# Patient Record
Sex: Male | Born: 1995 | Race: White | Hispanic: No | Marital: Single | Smoking: Smoker, current status unknown
Health system: Southern US, Community
[De-identification: ages and names within clinical notes are randomized; demographics above are authoritative.]

---
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
## 2013-09-15 ENCOUNTER — Inpatient Hospital Stay (HOSPITAL_COMMUNITY)
Admission: EM | Admit: 2013-09-15 | Discharge: 2013-09-21 | DRG: 963 | Disposition: A | Discharge status: Home Health | Payer: Medicaid Other | Attending: General Surgery | Admitting: General Surgery

## 2013-09-15 DIAGNOSIS — IMO0002 Reserved for concepts with insufficient information to code with codable children: Secondary | ICD-10-CM | POA: Diagnosis present

## 2013-09-15 DIAGNOSIS — S8263XA Displaced fracture of lateral malleolus of unspecified fibula, initial encounter for closed fracture: Secondary | ICD-10-CM | POA: Diagnosis present

## 2013-09-15 DIAGNOSIS — S058X9A Other injuries of unspecified eye and orbit, initial encounter: Secondary | ICD-10-CM | POA: Diagnosis present

## 2013-09-15 DIAGNOSIS — S270XXA Traumatic pneumothorax, initial encounter: Secondary | ICD-10-CM | POA: Diagnosis present

## 2013-09-15 DIAGNOSIS — R0902 Hypoxemia: Secondary | ICD-10-CM | POA: Diagnosis present

## 2013-09-15 DIAGNOSIS — S82109A Unspecified fracture of upper end of unspecified tibia, initial encounter for closed fracture: Secondary | ICD-10-CM | POA: Diagnosis present

## 2013-09-15 DIAGNOSIS — S02109A Fracture of base of skull, unspecified side, initial encounter for closed fracture: Principal | ICD-10-CM | POA: Diagnosis present

## 2013-09-15 DIAGNOSIS — S0180XA Unspecified open wound of other part of head, initial encounter: Secondary | ICD-10-CM | POA: Diagnosis present

## 2013-09-15 DIAGNOSIS — S064X9A Epidural hemorrhage with loss of consciousness of unspecified duration, initial encounter: Secondary | ICD-10-CM

## 2013-09-15 DIAGNOSIS — S066X9A Traumatic subarachnoid hemorrhage with loss of consciousness of unspecified duration, initial encounter: Principal | ICD-10-CM

## 2013-09-15 DIAGNOSIS — I498 Other specified cardiac arrhythmias: Secondary | ICD-10-CM | POA: Diagnosis present

## 2013-09-15 DIAGNOSIS — S064XAA Epidural hemorrhage with loss of consciousness status unknown, initial encounter: Secondary | ICD-10-CM

## 2013-09-15 DIAGNOSIS — H11429 Conjunctival edema, unspecified eye: Secondary | ICD-10-CM | POA: Diagnosis present

## 2013-09-15 DIAGNOSIS — S82891A Other fracture of right lower leg, initial encounter for closed fracture: Secondary | ICD-10-CM | POA: Diagnosis present

## 2013-09-15 DIAGNOSIS — S020XXA Fracture of vault of skull, initial encounter for closed fracture: Secondary | ICD-10-CM | POA: Diagnosis present

## 2013-09-15 DIAGNOSIS — S0230XA Fracture of orbital floor, unspecified side, initial encounter for closed fracture: Secondary | ICD-10-CM | POA: Diagnosis present

## 2013-09-15 DIAGNOSIS — S065X9A Traumatic subdural hemorrhage with loss of consciousness of unspecified duration, initial encounter: Principal | ICD-10-CM

## 2013-09-15 DIAGNOSIS — S0219XA Other fracture of base of skull, initial encounter for closed fracture: Secondary | ICD-10-CM

## 2013-09-15 DIAGNOSIS — S0292XA Unspecified fracture of facial bones, initial encounter for closed fracture: Secondary | ICD-10-CM

## 2013-09-15 DIAGNOSIS — F17200 Nicotine dependence, unspecified, uncomplicated: Secondary | ICD-10-CM | POA: Diagnosis present

## 2013-09-15 DIAGNOSIS — S0280XA Fracture of other specified skull and facial bones, unspecified side, initial encounter for closed fracture: Secondary | ICD-10-CM | POA: Diagnosis present

## 2013-09-15 DIAGNOSIS — J690 Pneumonitis due to inhalation of food and vomit: Secondary | ICD-10-CM | POA: Diagnosis present

## 2013-09-15 DIAGNOSIS — S82142A Displaced bicondylar fracture of left tibia, initial encounter for closed fracture: Secondary | ICD-10-CM

## 2013-09-16 ENCOUNTER — Inpatient Hospital Stay (HOSPITAL_COMMUNITY): Payer: Medicaid Other

## 2013-09-16 ENCOUNTER — Emergency Department (HOSPITAL_COMMUNITY): Payer: Medicaid Other

## 2013-09-16 ENCOUNTER — Encounter (HOSPITAL_COMMUNITY): Payer: Self-pay | Admitting: Emergency Medicine

## 2013-09-16 DIAGNOSIS — S064X9A Epidural hemorrhage with loss of consciousness of unspecified duration, initial encounter: Secondary | ICD-10-CM

## 2013-09-16 DIAGNOSIS — R0902 Hypoxemia: Secondary | ICD-10-CM | POA: Diagnosis present

## 2013-09-16 DIAGNOSIS — S0280XA Fracture of other specified skull and facial bones, unspecified side, initial encounter for closed fracture: Secondary | ICD-10-CM | POA: Diagnosis present

## 2013-09-16 DIAGNOSIS — S066XAA Traumatic subarachnoid hemorrhage with loss of consciousness status unknown, initial encounter: Secondary | ICD-10-CM

## 2013-09-16 DIAGNOSIS — S066X9A Traumatic subarachnoid hemorrhage with loss of consciousness of unspecified duration, initial encounter: Secondary | ICD-10-CM | POA: Diagnosis present

## 2013-09-16 DIAGNOSIS — S270XXA Traumatic pneumothorax, initial encounter: Secondary | ICD-10-CM | POA: Diagnosis present

## 2013-09-16 DIAGNOSIS — S058X9A Other injuries of unspecified eye and orbit, initial encounter: Secondary | ICD-10-CM | POA: Diagnosis present

## 2013-09-16 DIAGNOSIS — S065XAA Traumatic subdural hemorrhage with loss of consciousness status unknown, initial encounter: Secondary | ICD-10-CM

## 2013-09-16 DIAGNOSIS — S0280XB Fracture of other specified skull and facial bones, unspecified side, initial encounter for open fracture: Secondary | ICD-10-CM

## 2013-09-16 DIAGNOSIS — S064XAA Epidural hemorrhage with loss of consciousness status unknown, initial encounter: Secondary | ICD-10-CM

## 2013-09-16 DIAGNOSIS — S020XXA Fracture of vault of skull, initial encounter for closed fracture: Secondary | ICD-10-CM | POA: Diagnosis present

## 2013-09-16 DIAGNOSIS — S0180XA Unspecified open wound of other part of head, initial encounter: Secondary | ICD-10-CM | POA: Diagnosis present

## 2013-09-16 DIAGNOSIS — J690 Pneumonitis due to inhalation of food and vomit: Secondary | ICD-10-CM | POA: Diagnosis present

## 2013-09-16 DIAGNOSIS — IMO0002 Reserved for concepts with insufficient information to code with codable children: Secondary | ICD-10-CM | POA: Diagnosis present

## 2013-09-16 DIAGNOSIS — S02109A Fracture of base of skull, unspecified side, initial encounter for closed fracture: Secondary | ICD-10-CM | POA: Diagnosis not present

## 2013-09-16 DIAGNOSIS — J95821 Acute postprocedural respiratory failure: Secondary | ICD-10-CM

## 2013-09-16 DIAGNOSIS — S8263XA Displaced fracture of lateral malleolus of unspecified fibula, initial encounter for closed fracture: Secondary | ICD-10-CM | POA: Diagnosis present

## 2013-09-16 DIAGNOSIS — R404 Transient alteration of awareness: Secondary | ICD-10-CM | POA: Diagnosis present

## 2013-09-16 DIAGNOSIS — S0230XA Fracture of orbital floor, unspecified side, initial encounter for closed fracture: Secondary | ICD-10-CM | POA: Diagnosis present

## 2013-09-16 DIAGNOSIS — F17200 Nicotine dependence, unspecified, uncomplicated: Secondary | ICD-10-CM | POA: Diagnosis present

## 2013-09-16 DIAGNOSIS — I498 Other specified cardiac arrhythmias: Secondary | ICD-10-CM | POA: Diagnosis present

## 2013-09-16 DIAGNOSIS — H11429 Conjunctival edema, unspecified eye: Secondary | ICD-10-CM | POA: Diagnosis present

## 2013-09-16 DIAGNOSIS — S065X9A Traumatic subdural hemorrhage with loss of consciousness of unspecified duration, initial encounter: Secondary | ICD-10-CM | POA: Diagnosis not present

## 2013-09-16 DIAGNOSIS — S82109A Unspecified fracture of upper end of unspecified tibia, initial encounter for closed fracture: Secondary | ICD-10-CM | POA: Diagnosis present

## 2013-09-16 LAB — COMPREHENSIVE METABOLIC PANEL
ALT: 59 U/L — ABNORMAL HIGH (ref 0–53)
ANION GAP: 17 — AB (ref 5–15)
AST: 85 U/L — AB (ref 0–37)
Albumin: 4.2 g/dL (ref 3.5–5.2)
Alkaline Phosphatase: 78 U/L (ref 39–117)
BILIRUBIN TOTAL: 0.7 mg/dL (ref 0.3–1.2)
BUN: 13 mg/dL (ref 6–23)
CHLORIDE: 101 meq/L (ref 96–112)
CO2: 21 meq/L (ref 19–32)
Calcium: 9 mg/dL (ref 8.4–10.5)
Creatinine, Ser: 0.99 mg/dL (ref 0.50–1.35)
Glucose, Bld: 165 mg/dL — ABNORMAL HIGH (ref 70–99)
Potassium: 3.2 mEq/L — ABNORMAL LOW (ref 3.7–5.3)
Sodium: 139 mEq/L (ref 137–147)
Total Protein: 7.3 g/dL (ref 6.0–8.3)

## 2013-09-16 LAB — CBC
HCT: 38.5 % — ABNORMAL LOW (ref 39.0–52.0)
HCT: 44.3 % (ref 39.0–52.0)
Hemoglobin: 12.9 g/dL — ABNORMAL LOW (ref 13.0–17.0)
Hemoglobin: 15.2 g/dL (ref 13.0–17.0)
MCH: 29.7 pg (ref 26.0–34.0)
MCH: 30.4 pg (ref 26.0–34.0)
MCHC: 33.5 g/dL (ref 30.0–36.0)
MCHC: 34.3 g/dL (ref 30.0–36.0)
MCV: 88.6 fL (ref 78.0–100.0)
MCV: 88.7 fL (ref 78.0–100.0)
PLATELETS: 200 10*3/uL (ref 150–400)
Platelets: 288 10*3/uL (ref 150–400)
RBC: 4.34 MIL/uL (ref 4.22–5.81)
RBC: 5 MIL/uL (ref 4.22–5.81)
RDW: 12.8 % (ref 11.5–15.5)
RDW: 12.8 % (ref 11.5–15.5)
WBC: 10.3 10*3/uL (ref 4.0–10.5)
WBC: 18.5 10*3/uL — AB (ref 4.0–10.5)

## 2013-09-16 LAB — I-STAT ARTERIAL BLOOD GAS, ED
ACID-BASE DEFICIT: 1 mmol/L (ref 0.0–2.0)
Bicarbonate: 24.3 mEq/L — ABNORMAL HIGH (ref 20.0–24.0)
O2 Saturation: 100 %
PO2 ART: 279 mmHg — AB (ref 80.0–100.0)
Patient temperature: 98.6
TCO2: 26 mmol/L (ref 0–100)
pCO2 arterial: 42.3 mmHg (ref 35.0–45.0)
pH, Arterial: 7.367 (ref 7.350–7.450)

## 2013-09-16 LAB — ETHANOL

## 2013-09-16 LAB — MRSA PCR SCREENING: MRSA by PCR: NEGATIVE

## 2013-09-16 LAB — BASIC METABOLIC PANEL
Anion gap: 12 (ref 5–15)
BUN: 13 mg/dL (ref 6–23)
CHLORIDE: 107 meq/L (ref 96–112)
CO2: 24 meq/L (ref 19–32)
Calcium: 8.2 mg/dL — ABNORMAL LOW (ref 8.4–10.5)
Creatinine, Ser: 0.87 mg/dL (ref 0.50–1.35)
GFR calc non Af Amer: >90 mL/min (ref >90)
Glucose, Bld: 108 mg/dL — ABNORMAL HIGH (ref 70–99)
Potassium: 4.3 mEq/L (ref 3.7–5.3)
SODIUM: 143 meq/L (ref 137–147)

## 2013-09-16 LAB — I-STAT CHEM 8, ED
BUN: 13 mg/dL (ref 6–23)
CALCIUM ION: 1.17 mmol/L (ref 1.12–1.23)
Chloride: 102 mEq/L (ref 96–112)
Creatinine, Ser: 1.1 mg/dL (ref 0.50–1.35)
Glucose, Bld: 176 mg/dL — ABNORMAL HIGH (ref 70–99)
HCT: 48 % (ref 39.0–52.0)
HEMOGLOBIN: 16.3 g/dL (ref 13.0–17.0)
Potassium: 3 mEq/L — ABNORMAL LOW (ref 3.7–5.3)
SODIUM: 140 meq/L (ref 137–147)
TCO2: 21 mmol/L (ref 0–100)

## 2013-09-16 LAB — CDS SEROLOGY

## 2013-09-16 LAB — PREPARE FRESH FROZEN PLASMA
UNIT DIVISION: 0
Unit division: 0

## 2013-09-16 LAB — ABO/RH: ABO/RH(D): A POS

## 2013-09-16 LAB — TRIGLYCERIDES: TRIGLYCERIDES: 86 mg/dL (ref <150)

## 2013-09-16 LAB — I-STAT CG4 LACTIC ACID, ED: LACTIC ACID, VENOUS: 3.32 mmol/L — AB (ref 0.5–2.2)

## 2013-09-16 LAB — PROTIME-INR
INR: 1.13 (ref 0.00–1.49)
PROTHROMBIN TIME: 14.5 s (ref 11.6–15.2)

## 2013-09-16 MED ORDER — FENTANYL CITRATE 0.05 MG/ML IJ SOLN
50.0000 ug | Freq: Once | INTRAMUSCULAR | Status: AC
  Administered 2013-09-16: 50 ug via INTRAVENOUS

## 2013-09-16 MED ORDER — SUCCINYLCHOLINE CHLORIDE 20 MG/ML IJ SOLN
INTRAMUSCULAR | Status: AC
  Filled 2013-09-16: qty 1

## 2013-09-16 MED ORDER — BACITRACIN-NEOMYCIN-POLYMYXIN OINTMENT TUBE
TOPICAL_OINTMENT | Freq: Three times a day (TID) | CUTANEOUS | Status: DC
  Administered 2013-09-16: 1 via TOPICAL
  Administered 2013-09-16 (×2): via TOPICAL
  Administered 2013-09-16: 1 via TOPICAL
  Administered 2013-09-17 – 2013-09-21 (×13): via TOPICAL
  Filled 2013-09-16 (×2): qty 15

## 2013-09-16 MED ORDER — PROPOFOL 10 MG/ML IV EMUL
0.0000 ug/kg/min | INTRAVENOUS | Status: DC
  Administered 2013-09-16: 23 ug/kg/min via INTRAVENOUS
  Administered 2013-09-16 – 2013-09-17 (×2): 20 ug/kg/min via INTRAVENOUS
  Administered 2013-09-17 – 2013-09-18 (×3): 30 ug/kg/min via INTRAVENOUS
  Filled 2013-09-16 (×5): qty 100

## 2013-09-16 MED ORDER — PANTOPRAZOLE SODIUM 40 MG IV SOLR
40.0000 mg | Freq: Every day | INTRAVENOUS | Status: DC
  Administered 2013-09-16 – 2013-09-18 (×3): 40 mg via INTRAVENOUS
  Filled 2013-09-16 (×5): qty 40

## 2013-09-16 MED ORDER — PROPOFOL 10 MG/ML IV EMUL
INTRAVENOUS | Status: AC
  Filled 2013-09-16: qty 100

## 2013-09-16 MED ORDER — ACETAMINOPHEN 160 MG/5ML PO SOLN
650.0000 mg | Freq: Four times a day (QID) | ORAL | Status: DC | PRN
  Administered 2013-09-16 – 2013-09-19 (×6): 650 mg
  Filled 2013-09-16 (×7): qty 20.3

## 2013-09-16 MED ORDER — BIOTENE DRY MOUTH MT LIQD
15.0000 mL | Freq: Four times a day (QID) | OROMUCOSAL | Status: DC
  Administered 2013-09-16 – 2013-09-21 (×21): 15 mL via OROMUCOSAL

## 2013-09-16 MED ORDER — ATROPINE SULFATE 1 MG/ML IJ SOLN
INTRAMUSCULAR | Status: DC | PRN
  Administered 2013-09-16: .5 mg via INTRAVENOUS

## 2013-09-16 MED ORDER — PNEUMOCOCCAL VAC POLYVALENT 25 MCG/0.5ML IJ INJ
0.5000 mL | INJECTION | INTRAMUSCULAR | Status: DC
  Filled 2013-09-16: qty 0.5

## 2013-09-16 MED ORDER — CHLORHEXIDINE GLUCONATE 0.12 % MT SOLN
OROMUCOSAL | Status: AC
  Filled 2013-09-16: qty 15

## 2013-09-16 MED ORDER — PROPOFOL 10 MG/ML IV EMUL
5.0000 ug/kg/min | Freq: Once | INTRAVENOUS | Status: DC
  Filled 2013-09-16: qty 100

## 2013-09-16 MED ORDER — ONDANSETRON HCL 4 MG/2ML IJ SOLN
INTRAMUSCULAR | Status: AC
  Administered 2013-09-16: 4 mg
  Filled 2013-09-16: qty 2

## 2013-09-16 MED ORDER — PANTOPRAZOLE SODIUM 40 MG PO TBEC
40.0000 mg | DELAYED_RELEASE_TABLET | Freq: Every day | ORAL | Status: DC
  Administered 2013-09-19 – 2013-09-20 (×2): 40 mg via ORAL
  Filled 2013-09-16 (×2): qty 1

## 2013-09-16 MED ORDER — FENTANYL CITRATE 0.05 MG/ML IJ SOLN
INTRAMUSCULAR | Status: AC
  Filled 2013-09-16: qty 2

## 2013-09-16 MED ORDER — DEXTROSE 5 % IV SOLN: 1.0000 g | Freq: Once | INTRAVENOUS | Status: DC

## 2013-09-16 MED ORDER — LIDOCAINE HCL (CARDIAC) 20 MG/ML IV SOLN
INTRAVENOUS | Status: AC
  Filled 2013-09-16: qty 5

## 2013-09-16 MED ORDER — SODIUM CHLORIDE 0.9 % IV SOLN
INTRAVENOUS | Status: DC | PRN
  Administered 2013-09-16 (×2): 1000 mL via INTRAVENOUS

## 2013-09-16 MED ORDER — SODIUM CHLORIDE 0.9 % IV SOLN
0.0000 ug/h | INTRAVENOUS | Status: DC
  Administered 2013-09-16: 25 ug/h via INTRAVENOUS
  Administered 2013-09-17: 100 ug/h via INTRAVENOUS
  Filled 2013-09-16 (×3): qty 50

## 2013-09-16 MED ORDER — ROCURONIUM BROMIDE 50 MG/5ML IV SOLN
INTRAVENOUS | Status: AC
  Filled 2013-09-16: qty 2

## 2013-09-16 MED ORDER — BIOTENE DRY MOUTH MT LIQD: 15.0000 mL | Freq: Four times a day (QID) | OROMUCOSAL | Status: DC

## 2013-09-16 MED ORDER — FENTANYL BOLUS VIA INFUSION
50.0000 ug | INTRAVENOUS | Status: DC | PRN
  Filled 2013-09-16: qty 100

## 2013-09-16 MED ORDER — LIDOCAINE-EPINEPHRINE (PF) 2 %-1:200000 IJ SOLN
10.0000 mL | Freq: Once | INTRAMUSCULAR | Status: AC
  Administered 2013-09-16: 10 mL via INTRADERMAL

## 2013-09-16 MED ORDER — SUCCINYLCHOLINE CHLORIDE 20 MG/ML IJ SOLN
INTRAMUSCULAR | Status: DC | PRN
  Administered 2013-09-16: 50 mg via INTRAVENOUS
  Administered 2013-09-16 (×2): 100 mg via INTRAVENOUS

## 2013-09-16 MED ORDER — SODIUM CHLORIDE 0.9 % IV SOLN
INTRAVENOUS | Status: DC
  Administered 2013-09-16: 100 mL/h via INTRAVENOUS
  Administered 2013-09-17 – 2013-09-18 (×4): via INTRAVENOUS

## 2013-09-16 MED ORDER — FENTANYL CITRATE 0.05 MG/ML IJ SOLN
INTRAMUSCULAR | Status: DC | PRN
  Administered 2013-09-16: 100 ug via INTRAVENOUS

## 2013-09-16 MED ORDER — PROPOFOL 10 MG/ML IV EMUL
INTRAVENOUS | Status: DC | PRN
  Administered 2013-09-16: 20 ug/kg/min via INTRAVENOUS

## 2013-09-16 MED ORDER — CHLORHEXIDINE GLUCONATE 0.12 % MT SOLN
15.0000 mL | Freq: Two times a day (BID) | OROMUCOSAL | Status: DC
  Administered 2013-09-16 – 2013-09-21 (×10): 15 mL via OROMUCOSAL
  Filled 2013-09-16 (×8): qty 15

## 2013-09-16 MED ORDER — ERYTHROMYCIN 5 MG/GM OP OINT
TOPICAL_OINTMENT | Freq: Four times a day (QID) | OPHTHALMIC | Status: DC
  Administered 2013-09-16 (×3): 1 via OPHTHALMIC
  Administered 2013-09-17 – 2013-09-18 (×8): via OPHTHALMIC
  Administered 2013-09-19: 1 via OPHTHALMIC
  Administered 2013-09-19 – 2013-09-21 (×9): via OPHTHALMIC
  Filled 2013-09-16: qty 3.5

## 2013-09-16 MED ORDER — MIDAZOLAM HCL 2 MG/2ML IJ SOLN
INTRAMUSCULAR | Status: AC
  Filled 2013-09-16: qty 4

## 2013-09-16 MED ORDER — ETOMIDATE 2 MG/ML IV SOLN
INTRAVENOUS | Status: DC | PRN
  Administered 2013-09-16: 20 mg via INTRAVENOUS
  Administered 2013-09-16: 10 mg via INTRAVENOUS

## 2013-09-16 MED ORDER — ETOMIDATE 2 MG/ML IV SOLN
INTRAVENOUS | Status: AC
  Filled 2013-09-16: qty 20

## 2013-09-16 NOTE — ED Notes (Signed)
Report called to [facility] RN

## 2013-09-16 NOTE — Progress Notes (Signed)
[facility]. Intubated by Dr. Lindie SpruceWyatt using MAC 3. Tube placement verified by bilateral breath sounds and noted color change end tidal CO2. RT placed patient on ventilator per MD order.

## 2013-09-16 NOTE — ED Notes (Signed)
[facility] at bedside to intubate, GCS 8 at this time
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
## 2013-09-16 NOTE — ED Notes (Addendum)
[facility], glidascope. C-collar removed by Yelverton at 0115 to intubate, c-spine held by EMT.

## 2013-09-16 NOTE — ED Notes (Signed)
Pt vomiting clots of blood in CT room

## 2013-09-16 NOTE — ED Notes (Signed)
Damien FusiWyatt and Yelverton at bedside to evaluate, both MDs state that they do not want to intubate at this time d/t pt maintenance of airway
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
## 2013-09-16 NOTE — ED Notes (Signed)
During intubation, Darrell Hunter HR beginning to brady down, HR 30, Wyatt and Yelverton at bedside. Suctioned and repositioned, HR up to 47. See MAR, 0.5 MG atropine given

## 2013-09-16 NOTE — Progress Notes (Signed)
Pupils dilated for opthalmology exam at 1030 am. Per MD, lasts 3-6 hours. May have residual dilation into 7/27.

## 2013-09-16 NOTE — ED Notes (Signed)
Pt taken to CT.
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
## 2013-09-16 NOTE — Consult Note (Addendum)
Ophthalmology Initial Consult Note  Carvell,Massey T, 18 y.o. male Date of Service:  09/16/13   Requesting physician: Trauma Md, MD  Information Obtained from: Chart review, family Chief Complaint:  Rt orbital trauma (Pt intubated and sedated)  HPI/Discussion:  Darrell Hunter is a 18 y.o. male who [facility] He sustained trauma to the right orbit. Primary team concerned about the possibility of elevated IOP OD. Per family Darrell Hunter has no Hx of previous eye problems except the need for glasses.  Past Ocular Hx:  Refractive error Ocular Meds:  None Family ocular history: None  History reviewed. No pertinent past medical history. History reviewed. No pertinent past surgical history.  Prior to Admission Meds: No prescriptions prior to admission    No Known Allergies History  Substance Use Topics  . Smoking status: Unknown If Ever Smoked  . Smokeless tobacco: Not on file  . Alcohol Use: No   ROS: Unable to obtain given Pt intubated and sedated  Exam: Temp: 99.5 F (37.5 C) Pulse Rate: 55 BP: 131/77 mmHg Resp: 18 SpO2: 100 %  Visual Acuity:    cc  ph  near   OD Unable, sedated +        OS Unable, sedated +          OD OS  Confr Vis Fields Pt intubated and sedated Pt intubated and sedated  EOM (Primary) Pt intubated and sedated; Normal forced ductions Pt intubated and sedated  Lids/Lashes Periorbital erythema and edema WNL  Conjunctiva - Bulbar Injected, SCH WNL  Conjunctiva - Palpebral               Unable given edema WNL  Adnexa  Rt brow laceration repaired, no other lacerations seen; The is very mild proptosis and minimal firmness to retropulsion WNL  Pupils  3 to 2, round 3 to 2, round  Reaction, Direct Good response Good response                 Consensual Good response Good response                 RAPD No No  Cornea  Clear Clear  Anterior Chamber Deep and quiet Deep and quiet  Lens:  Clear Clear  IOP 23 11  Fundus - Dilated? Yes (D/w  primary team given intracranial heme and cleared for dilation)   Optic Disc - C:D Ratio 0.2 0.2                     Appearance  WNL WNL                     NF Layer WNL WNL  Post Seg:  Retina                    Vessels WNL WNL                  Vitreous  WNL WNL                  Macula WNL WNL                  Periphery WNL WNL       Neuro:  Oriented to person, place, and time:  Intubated and sedated Psychiatric:  Mood and Affect Appropriate:  Intubated and sedated  Labs/imaging:  CT head reviewed by me: Right ZMC fracture with mild displacement of malar eminence with  comminuted right temporal wall. Difficult to visualize the extent of the orbital floor fracture on the right without coronal and sagittal cuts. There is mild pneumo-orbit on the right. Small retrobulbar heme temporal. No intraconal heme. No tenting of the globe or straightening of the nerve.   A/P: 18 y.o. male with extensive right orbital fractures and right eyebrow laceration - Exam limited by sedation - Appropriate IOP given degree of swelling - No APD - No need for urgent surgical intervention - No evidence of entrapment on CT and normal forced ductions on the right - Recommend a dedicated CT of the orbits/face with sagittal and coronal cuts to assess the extent of the fractures and determine need for repair - Recommend Erythromycin or Bacitracin eye ointment to wounds and eyes QID while sedated - Will follow peripherally - The patient can follow-up as an outpatient within 3 days of discharge - Need to assess VA, CVF, motility, etc  Marchelle Gearing, MD 09/16/2013, 10:34 AM

## 2013-09-16 NOTE — ED Notes (Signed)
Optholomology MD at bedside to suture eye

## 2013-09-16 NOTE — Progress Notes (Signed)
Transported pt via vent to ct scan and back

## 2013-09-16 NOTE — ED Notes (Signed)
Portable chest completed, [facility] advanced airway
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
## 2013-09-16 NOTE — Progress Notes (Signed)
Pt seen and examined. No issues overnight. Pt remains intubated on propofol, does become very agitated with decreased sedation or stimulation.  EXAM: Temp:  [96.5 F (35.8 C)-99.5 F (37.5 C)] 99.5 F (37.5 C) (07/26 0800) Pulse Rate:  [55-135] 56 (07/26 0800) Resp:  [17-18] 18 (07/26 0800) BP: (66-163)/(36-114) 129/78 mmHg (07/26 0800) SpO2:  [96 %-100 %] 100 % (07/26 0800) FiO2 (%):  [30 %-60 %] 30 % (07/26 0800) Weight:  [68.04 kg (150 lb)] 68.04 kg (150 lb) (07/26 0145) Intake/Output     07/25 0701 - 07/26 0700 07/26 0701 - 07/27 0700   I.V. (mL/kg) 2358.1 (34.7) 10.7 (0.2)   Total Intake(mL/kg) 2358.1 (34.7) 10.7 (0.2)   Urine (mL/kg/hr) 400    Emesis/NG output 100    Other 1    Total Output 501     Net +1857.1 +10.7         On low-dose propofol: Attempts to open eyes Left pupil reactive Follows commands in all extremities symetrically Right periorbital edema  LABS: Lab Results  Component Value Date   CREATININE 0.87 09/16/2013   BUN 13 09/16/2013   NA 143 09/16/2013   K 4.3 09/16/2013   CL 107 09/16/2013   CO2 24 09/16/2013   Lab Results  Component Value Date   WBC 18.5* 09/16/2013   HGB 12.9* 09/16/2013   HCT 38.5* 09/16/2013   MCV 88.7 09/16/2013   PLT 200 09/16/2013    IMPRESSION: - 18 [facility]. [facility] exam and small right sphenoid fracture with small underlying EDH - Intubated for respiratory/airway protection  PLAN: - Cont close neurologic observation. Does not require ICP monitor at this time - Repeat CTH to evaluate for stability of EDH early this afternoon - Can attempt to wean vent per trauma

## 2013-09-16 NOTE — ED Notes (Signed)
Pt mental status beginning to deteriorate, unable to stay awake at this time, responsive to sternal rub and ammonia, not responsive to verbal commands. Pt vomiting blood. [facility] notified

## 2013-09-16 NOTE — ED Notes (Signed)
[facility] at bedside to intubate

## 2013-09-16 NOTE — ED Notes (Signed)
Pt took c-collar off during CT scan and moved neck, c-collar stabilized by Nathaneil CanaryEOlsen RN and c-collar replaced.

## 2013-09-16 NOTE — Procedures (Signed)
FAST performed was negative in all four quadrant    Epi    RUQ   LUQ   Pelvic

## 2013-09-16 NOTE — ED Notes (Signed)
When logrolled to remove spine board, pt immediately stated he was going to vomit. Emesis bloody, no clots present.

## 2013-09-17 ENCOUNTER — Inpatient Hospital Stay (HOSPITAL_COMMUNITY): Payer: Medicaid Other

## 2013-09-17 LAB — TYPE AND SCREEN
ABO/RH(D): A POS
Antibody Screen: NEGATIVE
Unit division: 0
Unit division: 0

## 2013-09-17 MED ORDER — LORAZEPAM 2 MG/ML IJ SOLN
1.0000 mg | INTRAMUSCULAR | Status: DC | PRN
  Administered 2013-09-20: 1 mg via INTRAVENOUS
  Filled 2013-09-17: qty 1
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
## 2013-09-17 NOTE — Progress Notes (Signed)
Pt seen and examined. No issues overnight.   EXAM: Temp:  [99 F (37.2 C)-101.5 F (38.6 C)] 100 F (37.8 C) (07/27 1200) Pulse Rate:  [48-71] 57 (07/27 1200) Resp:  [9-21] 9 (07/27 1200) BP: (101-130)/(51-95) 120/67 mmHg (07/27 1200) SpO2:  [100 %] 100 % (07/27 1200) FiO2 (%):  [30 %] 30 % (07/27 1229) Intake/Output     07/26 0701 - 07/27 0700 07/27 0701 - 07/28 0700   I.V. (mL/kg) 2601.8 (38.2) 611 (9)   Other 575    NG/GT 75    Total Intake(mL/kg) 3251.8 (47.8) 611 (9)   Urine (mL/kg/hr) 1250 (0.8) 400 (1)   Emesis/NG output     Other     Total Output 1250 400   Net +2001.8 +211         Opens eyes to voice Intubated, breathes spontaneously Right eye edematous Follows commands, good strength all extremities  LABS: Lab Results  Component Value Date   CREATININE 0.87 09/16/2013   BUN 13 09/16/2013   NA 143 09/16/2013   K 4.3 09/16/2013   CL 107 09/16/2013   CO2 24 09/16/2013   Lab Results  Component Value Date   WBC 18.5* 09/16/2013   HGB 12.9* 09/16/2013   HCT 38.5* 09/16/2013   MCV 88.7 09/16/2013   PLT 200 09/16/2013    IMPRESSION: - 18 y.[facility]. [facility] small right temporal EDH, neurologically stable with good exam  PLAN: - Cont close neurologic observation - Attempt to extubate per trauma

## 2013-09-17 NOTE — Progress Notes (Signed)
UR completed.  Tiarrah Saville, RN BSN MHA CCM Trauma/Neuro ICU Case Manager [phone]  

## 2013-09-18 ENCOUNTER — Inpatient Hospital Stay (HOSPITAL_COMMUNITY): Payer: Medicaid Other

## 2013-09-18 LAB — BASIC METABOLIC PANEL
Anion gap: 11 (ref 5–15)
BUN: 8 mg/dL (ref 6–23)
CO2: 22 mEq/L (ref 19–32)
Calcium: 8.6 mg/dL (ref 8.4–10.5)
Chloride: 102 mEq/L (ref 96–112)
Creatinine, Ser: 0.77 mg/dL (ref 0.50–1.35)
GFR calc Af Amer: >90 mL/min (ref >90)
GFR calc non Af Amer: >90 mL/min (ref >90)
Glucose, Bld: 94 mg/dL (ref 70–99)
Potassium: 4 mEq/L (ref 3.7–5.3)
SODIUM: 135 meq/L — AB (ref 137–147)

## 2013-09-18 LAB — CBC
HCT: 38.4 % — ABNORMAL LOW (ref 39.0–52.0)
Hemoglobin: 12.9 g/dL — ABNORMAL LOW (ref 13.0–17.0)
MCH: 30.4 pg (ref 26.0–34.0)
MCHC: 33.6 g/dL (ref 30.0–36.0)
MCV: 90.4 fL (ref 78.0–100.0)
Platelets: 145 10*3/uL — ABNORMAL LOW (ref 150–400)
RBC: 4.25 MIL/uL (ref 4.22–5.81)
RDW: 12.8 % (ref 11.5–15.5)
WBC: 12.7 10*3/uL — ABNORMAL HIGH (ref 4.0–10.5)

## 2013-09-18 MED ORDER — OXYCODONE HCL 5 MG PO TABS
5.0000 mg | ORAL_TABLET | ORAL | Status: DC | PRN
  Administered 2013-09-18 – 2013-09-20 (×8): 10 mg via ORAL
  Filled 2013-09-18 (×8): qty 2

## 2013-09-18 MED ORDER — TAMSULOSIN HCL 0.4 MG PO CAPS
0.4000 mg | ORAL_CAPSULE | Freq: Every day | ORAL | Status: DC
  Administered 2013-09-18 – 2013-09-20 (×3): 0.4 mg via ORAL
  Filled 2013-09-18 (×3): qty 1

## 2013-09-18 MED ORDER — FENTANYL CITRATE 0.05 MG/ML IJ SOLN
50.0000 ug | INTRAMUSCULAR | Status: DC | PRN
  Administered 2013-09-18 – 2013-09-20 (×15): 50 ug via INTRAVENOUS
  Filled 2013-09-18 (×15): qty 2

## 2013-09-18 MED ORDER — BETHANECHOL CHLORIDE 25 MG PO TABS
25.0000 mg | ORAL_TABLET | Freq: Three times a day (TID) | ORAL | Status: DC
  Administered 2013-09-18 – 2013-09-20 (×6): 25 mg via ORAL
  Filled 2013-09-18 (×7): qty 1

## 2013-09-18 NOTE — Progress Notes (Signed)
1030 foley catheter removed, 1430 bladder scan revealed 487mL, 1630 bladder scan revealed >999 then I & O'd pt and 1050mL emptied.  Dr. Janee Mornhompson made aware.  Urecholine and Flomax was ordered.

## 2013-09-18 NOTE — Procedures (Signed)
Extubation Procedure Note  Patient Details:   Name: Darrell Hunter DOB: Jul 14, 1995 MRN: [id number]   Airway Documentation:  Airway 6.5 mm (Active)  Secured at (cm) 24 cm 09/18/2013  7:45 AM  Measured From Lips 09/18/2013  7:45 AM  Secured Location Center 09/18/2013  7:45 AM  Secured By [employer] Tube Holder 09/18/2013  7:45 AM  Tube Holder Repositioned Yes 09/18/2013  7:45 AM  Cuff Pressure (cm H2O) 10 cm H2O 09/16/2013  1:42 AM  Site Condition Dry 09/18/2013  7:45 AM    Evaluation  O2 sats: stable throughout Complications: No apparent complications Patient did tolerate procedure well. Bilateral Breath Sounds: Clear;Diminished Suctioning: Oral;Airway Yes PT was extubated to a 3L [place]. PT was able to verbalize his name. Sats are stable  Darrell Hunter, Darrell Hunter 09/18/2013, 10:14 AM

## 2013-09-18 NOTE — Progress Notes (Signed)
Pt's mother said pt has a history of migraines triggered by lack of caffeine or change in diet.  He does not take any prescription medications for migraines, only takes Excedrin if he starts to feel the onset of one.

## 2013-09-18 NOTE — Progress Notes (Signed)
Pt seen and examined. No issues overnight. Plan is to extubate this am.  EXAM: Temp:  [98.8 F (37.1 C)-103.1 F (39.5 C)] 100.8 F (38.2 C) (07/28 0900) Pulse Rate:  [53-91] 63 (07/28 0900) Resp:  [9-22] 15 (07/28 0900) BP: (101-140)/(51-82) 132/67 mmHg (07/28 0900) SpO2:  [88 %-100 %] 99 % (07/28 0900) FiO2 (%):  [30 %-100 %] 30 % (07/28 0745) Intake/Output     07/27 0701 - 07/28 0700 07/28 0701 - 07/29 0700   I.V. (mL/kg) 2898.6 (42.6) 229.3 (3.4)   Other     NG/GT     Total Intake(mL/kg) 2898.6 (42.6) 229.3 (3.4)   Urine (mL/kg/hr) 2160 (1.3) 665 (3.9)   Emesis/NG output 600 (0.4)    Total Output 2760 665   Net +138.6 -435.7        Stool Occurrence 1 x     Awake, alert On spont breathing trial Opens eyes to voice Follows commands, good strength all extremities  LABS: Lab Results  Component Value Date   CREATININE 0.77 09/18/2013   BUN 8 09/18/2013   NA 135* 09/18/2013   K 4.0 09/18/2013   CL 102 09/18/2013   CO2 22 09/18/2013   Lab Results  Component Value Date   WBC 12.7* 09/18/2013   HGB 12.9* 09/18/2013   HCT 38.4* 09/18/2013   MCV 90.4 09/18/2013   PLT 145* 09/18/2013    IMPRESSION: - 18 y.[facility]. [facility] exam and small non-operative right sphenoid fracture with underlying small EDH - Multiple right-sided facial fractures  PLAN: - Cont neurologic observation - Extubate today per trauma
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
## 2013-09-18 NOTE — Evaluation (Signed)
Physical Therapy Evaluation Patient Details Name: Darrell HailQuentin T Burpee MRN: [id number] DOB: 10/27/1995 Today's Date: 09/18/2013   History of Present Illness     Clinical Impression  Patient demonstrates deficits in functional mobility as indicated below. Patient with behaviors consistent with Rancho Level 6 (Confused, Appropriate Response). Patient is using call bell approprietly, following commands and demonstrates appropriate responses consistently. Patient does have some deficits with carry over of new information.  At this time, most limiting factors for mobility appear orthopedic in nature, will await further WBing status and status of brace for LEs,  Patient tolerated EOB activity well. Will see as indicated and progress as tolerated.     Follow Up Recommendations Supervision/Assistance - 24 hour    Equipment Recommendations  Other (comment) (TBD)    Recommendations for Other Services       Precautions / Restrictions Precautions Precautions: Fall Restrictions Weight Bearing Restrictions: Yes LLE Weight Bearing: Non weight bearing Other Position/Activity Restrictions: Awaiting ortho input for follow up right ankle      Mobility  Bed Mobility Overal bed mobility: Needs Assistance Bed Mobility: Rolling;Sidelying to Sit;Sit to Sidelying Rolling: Min assist Sidelying to sit: Min assist     Sit to sidelying: Min assist General bed mobility comments: VCs for positioning and technique, assist for elevation to upright, and VCs for NWBing through LEs  Transfers                    Ambulation/Gait                Stairs            Wheelchair Mobility    Modified Rankin (Stroke Patients Only)       Balance Overall balance assessment: Needs assistance Sitting-balance support: Feet supported Sitting balance-Leahy Scale: Fair Sitting balance - Comments: min assist                                     Pertinent Vitals/Pain 1/10 pain at  rest, increased headache and orbit pain with coughing spell.  HR elevated to 120s with coughing spell, all other VSS    Home Living Family/patient expects to be discharged to:: Private residence Living Arrangements: Parent Available Help at Discharge: Family Type of Home: House Home Access: Stairs to enter Entrance Stairs-Rails: Can reach both Entrance Stairs-Number of Steps: 4 Home Layout: One level Home Equipment: None      Prior Function Level of Independence: Independent               Hand Dominance   Dominant Hand: Right    Extremity/Trunk Assessment   Upper Extremity Assessment: Overall WFL for tasks assessed           Lower Extremity Assessment: RLE deficits/detail;LLE deficits/detail   LLE Deficits / Details: left tibial plateau fracture      Communication   Communication: No difficulties  Cognition Arousal/Alertness: Lethargic Behavior During Therapy: WFL for tasks assessed/performed Overall Cognitive Status: Impaired/Different from baseline Area of Impairment: Attention;Memory;Problem solving   Current Attention Level: Sustained Memory: Decreased short-term memory       Problem Solving: Slow processing;Requires verbal cues;Requires tactile cues      General Comments General comments (skin integrity, edema, etc.): contusions and swelling Right orbit, right ankle, left knee    Exercises        Assessment/Plan    PT Assessment Patient needs continued PT services  PT  Diagnosis Difficulty walking;Abnormality of gait;Generalized weakness;Acute pain   PT Problem List Decreased strength;Decreased range of motion;Decreased activity tolerance;Decreased balance;Decreased mobility;Decreased knowledge of use of DME;Pain  PT Treatment Interventions DME instruction;Gait training;Stair training;Functional mobility training;Therapeutic activities;Therapeutic exercise;Balance training;Patient/family education   PT Goals (Current goals can be found in the  Care Plan section) Acute Rehab PT Goals Patient Stated Goal: to go home PT Goal Formulation: With patient Time For Goal Achievement: 10/02/13 Potential to Achieve Goals: Good    Frequency Min 4X/week   Barriers to discharge        Co-evaluation               End of Session   Activity Tolerance: Patient limited by pain Patient left: in bed;with call bell/phone within reach Nurse Communication: Mobility status         Time: 4098-1191 PT Time Calculation (min): 19 min   Charges:   PT Evaluation $Initial PT Evaluation Tier I: 1 Procedure PT Treatments $Therapeutic Activity: 8-22 mins   PT G CodesFabio Asa 09/18/2013, 4:12 PM Charlotte Crumb, PT DPT  [phone]

## 2013-09-19 ENCOUNTER — Inpatient Hospital Stay (HOSPITAL_COMMUNITY): Payer: Medicaid Other

## 2013-09-19 LAB — TRIGLYCERIDES: Triglycerides: 100 mg/dL (ref <150)
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
## 2013-09-19 NOTE — Progress Notes (Signed)
Orthopedic Tech Progress Note Patient Details:  Darrell HailQuentin T Carranco Jun 15, 1995 [id number] [employer] Type of Ortho Device: Ankle Air splint;Ace wrap Ortho Device/Splint Location: RLE Ortho Device/Splint Interventions: Application   Asia R Thompson 09/19/2013, 10:14 AM
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
## 2013-09-19 NOTE — Progress Notes (Signed)
Orthopedic Tech Progress Note Patient Details:  Darrell Hunter [birth year] [id number] Advanced called for brace order Patient ID: Darrell HailQuentin T Hunter, male   DOB: [birth year], 18 y.o.   MRN: [id number]   [name] 09/19/2013, 10:05 AM
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
## 2013-09-19 NOTE — Progress Notes (Signed)
2244 paged Dr. Derrell Lollingamirez regarding persistent oozing of blood from sutures on patient's right eye. Orders given to apply ice. Will continue to monitor.

## 2013-09-19 NOTE — Progress Notes (Signed)
OT Cancellation Note  Patient Details Name: Sheryle HailQuentin T Mahaffy MRN: [id number] DOB: 22-Aug-1995   Cancelled Treatment:    Reason Eval/Treat Not Completed: Other (comment) (brace being ordered this day.  Will perform eval after brace is delivered) as schedule allows.  Alba CoryREDDING, Mikala Podoll D 09/19/2013, 10:24 AM
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
## 2013-09-20 ENCOUNTER — Inpatient Hospital Stay (HOSPITAL_COMMUNITY): Payer: Medicaid Other

## 2013-09-20 DIAGNOSIS — S82899A Other fracture of unspecified lower leg, initial encounter for closed fracture: Secondary | ICD-10-CM

## 2013-09-20 DIAGNOSIS — S069XAA Unspecified intracranial injury with loss of consciousness status unknown, initial encounter: Secondary | ICD-10-CM

## 2013-09-20 DIAGNOSIS — R339 Retention of urine, unspecified: Secondary | ICD-10-CM

## 2013-09-20 DIAGNOSIS — S069X9A Unspecified intracranial injury with loss of consciousness of unspecified duration, initial encounter: Secondary | ICD-10-CM

## 2013-09-20 DIAGNOSIS — S82209A Unspecified fracture of shaft of unspecified tibia, initial encounter for closed fracture: Secondary | ICD-10-CM

## 2013-09-20 MED ORDER — BETHANECHOL CHLORIDE 10 MG PO TABS
10.0000 mg | ORAL_TABLET | Freq: Four times a day (QID) | ORAL | Status: DC
  Administered 2013-09-20 – 2013-09-21 (×3): 10 mg via ORAL
  Filled 2013-09-20 (×3): qty 1

## 2013-09-20 MED ORDER — OXYCODONE HCL 5 MG PO TABS
10.0000 mg | ORAL_TABLET | ORAL | Status: DC | PRN
  Administered 2013-09-20: 10 mg via ORAL
  Administered 2013-09-21 (×2): 20 mg via ORAL
  Filled 2013-09-20: qty 4
  Filled 2013-09-20: qty 2
  Filled 2013-09-20: qty 4

## 2013-09-20 MED ORDER — TRAMADOL HCL 50 MG PO TABS
100.0000 mg | ORAL_TABLET | Freq: Four times a day (QID) | ORAL | Status: DC
  Administered 2013-09-20 – 2013-09-21 (×5): 100 mg via ORAL
  Filled 2013-09-20 (×5): qty 2

## 2013-09-20 NOTE — Progress Notes (Signed)
Increase pain regimine and continue TBI team. Patient examined and I agree with the assessment and plan  [name] Shimika Ames, MD, MPH, FACS Trauma: [phone] General Surgery: [phone]  09/20/2013 10:53 AM
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
## 2013-09-20 NOTE — Progress Notes (Signed)
SLP Cancellation Note  Patient Details Name: Darrell HailQuentin T Zaccaro MRN: [id number] DOB: 1995-09-27   Cancelled treatment:        Attempted to see pt for therapy, but he is currently in MRI.  Will f/u 7/31.   Maryjo RochesterWillis, [place] Paiz T 09/20/2013, 3:32 PM

## 2013-09-20 NOTE — Clinical Social Work Note (Signed)
Clinical Social Worker continuing to follow patient and family for support and discharge planning needs.  CSW spoke with patient step dad at bedside who states that patient plans to return home with him and his mother at discharge.  Patient is currently down in MRI.  CSW to follow up with patient to complete SBIRT assessment prior to discharge.  CSW remains available for support as needed.  Macario GoldsJesse Yicel Shannon, [place] [phone]

## 2013-09-21 DIAGNOSIS — S0292XA Unspecified fracture of facial bones, initial encounter for closed fracture: Secondary | ICD-10-CM

## 2013-09-21 DIAGNOSIS — S82142A Displaced bicondylar fracture of left tibia, initial encounter for closed fracture: Secondary | ICD-10-CM

## 2013-09-21 DIAGNOSIS — S0219XA Other fracture of base of skull, initial encounter for closed fracture: Secondary | ICD-10-CM

## 2013-09-21 DIAGNOSIS — S82891A Other fracture of right lower leg, initial encounter for closed fracture: Secondary | ICD-10-CM | POA: Diagnosis present

## 2013-09-21 MED ORDER — TRAMADOL HCL 50 MG PO TABS: 100.0000 mg | ORAL_TABLET | Freq: Four times a day (QID) | ORAL | Status: AC

## 2013-09-21 MED ORDER — OXYCODONE-ACETAMINOPHEN 10-325 MG PO TABS: 1.0000 | ORAL_TABLET | ORAL | Status: AC | PRN
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
## 2013-09-21 NOTE — Progress Notes (Signed)
D/C  Patient examined and I agree with the assessment and plan  [name] Jennefer Kopp, MD, MPH, FACS Trauma: [phone] General Surgery: [phone]  09/21/2013 10:13 AM
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
## 2013-09-21 NOTE — Clinical Social Work Note (Signed)
Clinical Social Worker continuing to follow patient and family for support and discharge planning needs.  Patient was discharged home with home health and his family prior to CSW arrival.  Patient mother had stated in previous conversation that there were no concerns with current substance use and patient toxicology screen was negative.  CSW unable to complete SBIRT with patient prior to discharge.    Clinical Social Worker will sign off for now as social work intervention is no longer needed. Please consult us again if new need arises.  Macario GoldsJesse Kailea Dannemiller, [place] [phone]

## 2013-09-21 NOTE — Progress Notes (Signed)
Patient is discharged from room 4N22 at this time. Alert and in stable condition. IV site d/c'd. Instructions read to patient and understanding verbalized. Left unit via wheelchair with family and belonging at side.
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
## 2013-09-21 NOTE — Discharge Summary (Signed)
Pamla Pangle, MD, MPH, FACS Trauma: [phone] General Surgery: [phone]  

## 2013-10-02 NOTE — Consult Note (Signed)
I have seen and examined the patient. I agree with the findings above. Slight pain but no opening to varus/ valgus stress.  Budd PalmerHANDY,Justyce Yeater H, MD 4:03 PM

## 2020-04-27 ENCOUNTER — Emergency Department
Admission: EM | Admit: 2020-04-27 | Discharge: 2020-04-27 | Disposition: A | Discharge status: Home / Self Care | Payer: BC Managed Care – PPO | Attending: Emergency Medicine | Admitting: Emergency Medicine

## 2020-04-27 ENCOUNTER — Emergency Department: Payer: BC Managed Care – PPO

## 2020-04-27 ENCOUNTER — Other Ambulatory Visit: Payer: Self-pay

## 2020-04-27 DIAGNOSIS — F17210 Nicotine dependence, cigarettes, uncomplicated: Secondary | ICD-10-CM | POA: Diagnosis not present

## 2020-04-27 DIAGNOSIS — R109 Unspecified abdominal pain: Secondary | ICD-10-CM | POA: Diagnosis present

## 2020-04-27 DIAGNOSIS — R300 Dysuria: Secondary | ICD-10-CM

## 2020-04-27 DIAGNOSIS — N390 Urinary tract infection, site not specified: Secondary | ICD-10-CM | POA: Insufficient documentation

## 2020-04-27 LAB — URINALYSIS, COMPLETE (UACMP) WITH MICROSCOPIC
Bilirubin Urine: NEGATIVE
Glucose, UA: NEGATIVE mg/dL
Ketones, ur: 5 mg/dL — AB
Nitrite: NEGATIVE
Protein, ur: 30 mg/dL — AB
RBC / HPF: >50 RBC/hpf — ABNORMAL HIGH (ref 0–5)
Specific Gravity, Urine: 1.021 (ref 1.005–1.030)
Squamous Epithelial / LPF: NONE SEEN (ref 0–5)
WBC, UA: >50 WBC/hpf — ABNORMAL HIGH (ref 0–5)
pH: 6 (ref 5.0–8.0)

## 2020-04-27 LAB — BASIC METABOLIC PANEL
Anion gap: 10 (ref 5–15)
BUN: 12 mg/dL (ref 6–20)
CO2: 21 mmol/L — ABNORMAL LOW (ref 22–32)
Calcium: 9.3 mg/dL (ref 8.9–10.3)
Chloride: 102 mmol/L (ref 98–111)
Creatinine, Ser: 0.81 mg/dL (ref 0.61–1.24)
GFR, Estimated: >60 mL/min (ref >60)
Glucose, Bld: 122 mg/dL — ABNORMAL HIGH (ref 70–99)
Potassium: 4 mmol/L (ref 3.5–5.1)
Sodium: 133 mmol/L — ABNORMAL LOW (ref 135–145)

## 2020-04-27 LAB — CBC
HCT: 44 % (ref 39.0–52.0)
Hemoglobin: 15.5 g/dL (ref 13.0–17.0)
MCH: 31.4 pg (ref 26.0–34.0)
MCHC: 35.2 g/dL (ref 30.0–36.0)
MCV: 89.1 fL (ref 80.0–100.0)
Platelets: 283 10*3/uL (ref 150–400)
RBC: 4.94 MIL/uL (ref 4.22–5.81)
RDW: 12.4 % (ref 11.5–15.5)
WBC: 17.9 10*3/uL — ABNORMAL HIGH (ref 4.0–10.5)
nRBC: 0 % (ref 0.0–0.2)

## 2020-04-27 MED ORDER — PHENAZOPYRIDINE HCL 200 MG PO TABS: 200.0000 mg | ORAL_TABLET | Freq: Three times a day (TID) | ORAL | 0 refills | Status: AC | PRN

## 2020-04-27 MED ORDER — SODIUM CHLORIDE 0.9 % IV SOLN
1.0000 g | Freq: Once | INTRAVENOUS | Status: AC
  Administered 2020-04-27: 1 g via INTRAVENOUS
  Filled 2020-04-27: qty 10

## 2020-04-27 MED ORDER — CEFDINIR 300 MG PO CAPS: 300.0000 mg | ORAL_CAPSULE | Freq: Two times a day (BID) | ORAL | 0 refills | Status: AC

## 2020-04-27 MED ORDER — KETOROLAC TROMETHAMINE 30 MG/ML IJ SOLN
30.0000 mg | Freq: Once | INTRAMUSCULAR | Status: AC
  Administered 2020-04-27: 30 mg via INTRAVENOUS
  Filled 2020-04-27: qty 1

## 2020-04-27 MED ORDER — SODIUM CHLORIDE 0.9 % IV BOLUS
1000.0000 mL | Freq: Once | INTRAVENOUS | Status: AC
  Administered 2020-04-27: 1000 mL via INTRAVENOUS

## 2020-04-27 NOTE — ED Triage Notes (Signed)
Reports bilateral flank pain, urinary frequency, dark urine and scant amounts. All sx started yesterday/last night. Burning with urianation. Pt alert and oriented X4, cooperative, RR even and unlabored, color WNL. Pt in NAD.
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
## 2020-04-29 LAB — URINE CULTURE: Culture: >=100000 — AB

## 2020-04-29 LAB — CULTURE, BLOOD (SINGLE)

## 2020-05-02 LAB — CULTURE, BLOOD (SINGLE): Culture: NO GROWTH

## 2021-08-07 IMAGING — CT CT ABD-PELV W/O CM
3 of 4 series · 8 of 46 positions shown, 15 images · non-contrast
Comparison: September 16, 2013

CLINICAL DATA: Bilateral flank pain, urinary frequency, and dark
urine.

EXAM:
CT ABDOMEN AND PELVIS WITHOUT CONTRAST
TECHNIQUE: Multidetector CT imaging of the abdomen and pelvis was performed
following the standard protocol without IV contrast.

[Series 4: lung bases · axial · 0.57mm/px · z∈[-188,-128]mm · 4 of 20 slices shown, 9 images]
[im 4/20  soft-tissue]
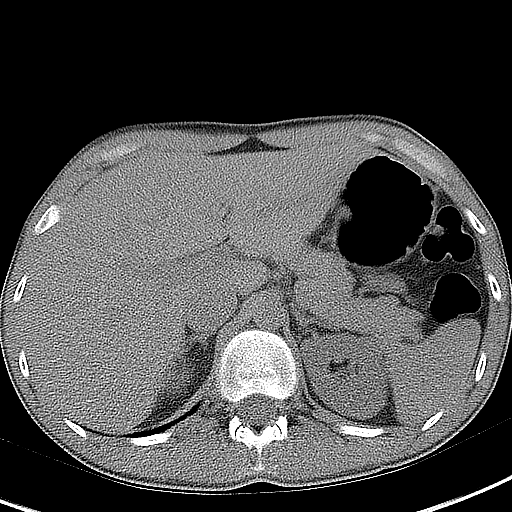
[im 4/20  lung]
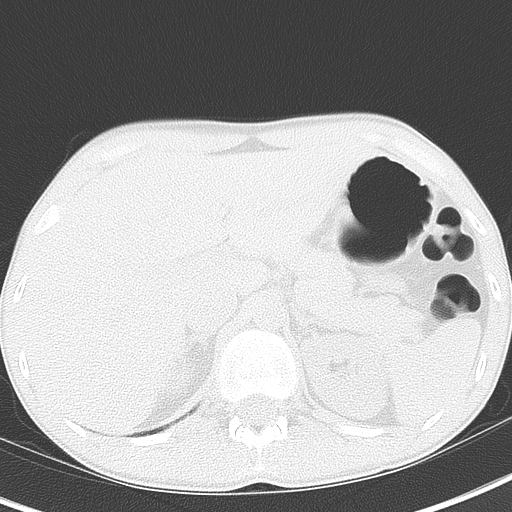
[im 4/20  bone]
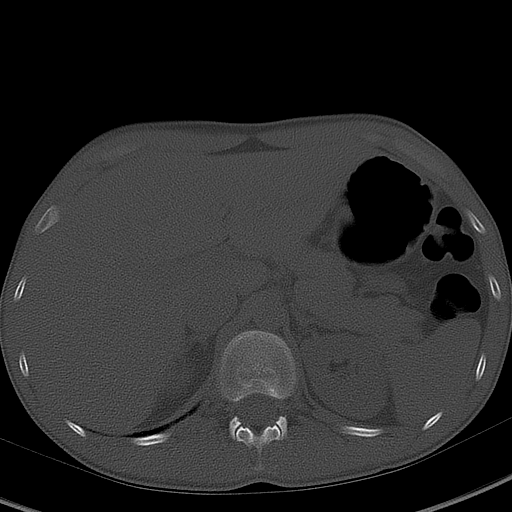
[im 8/20  soft-tissue]
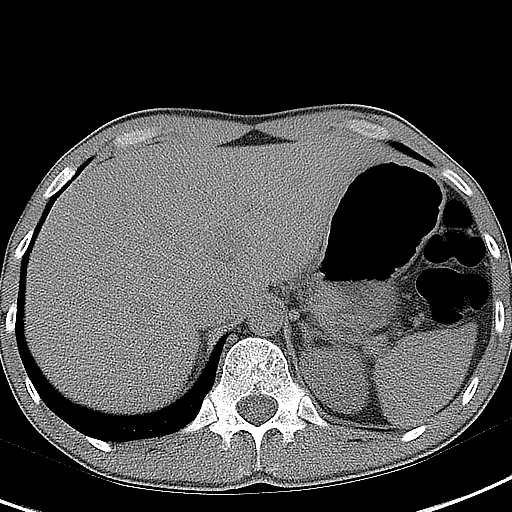
[im 8/20  lung]
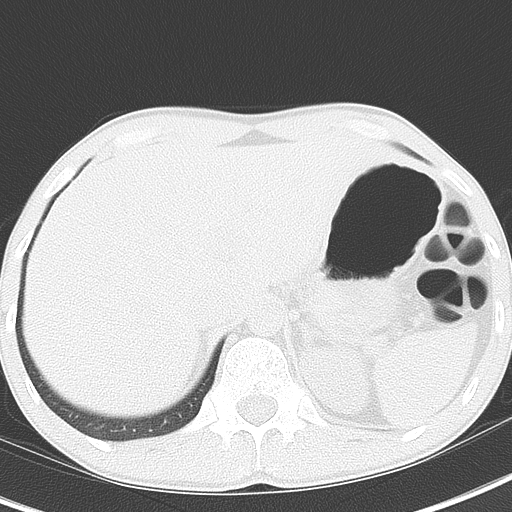
[im 12/20  soft-tissue]
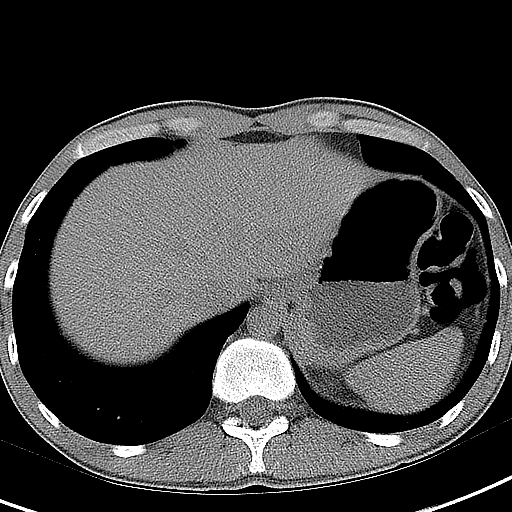
[im 12/20  lung]
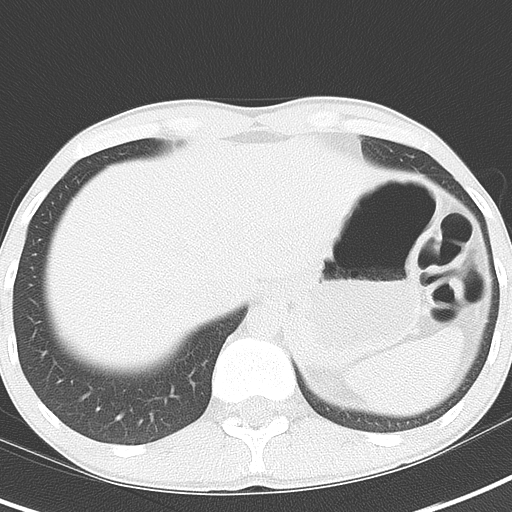
[im 16/20  soft-tissue]
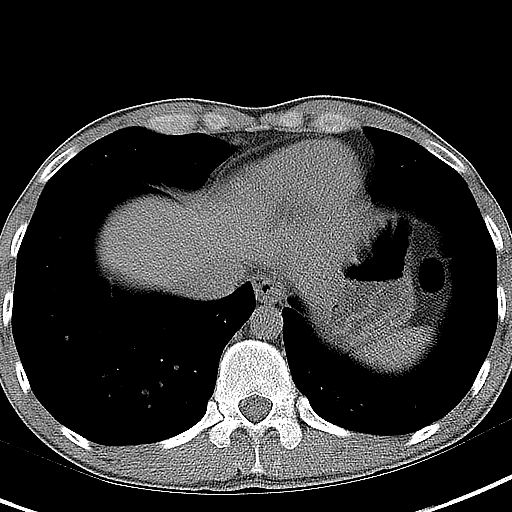
[im 16/20  lung]
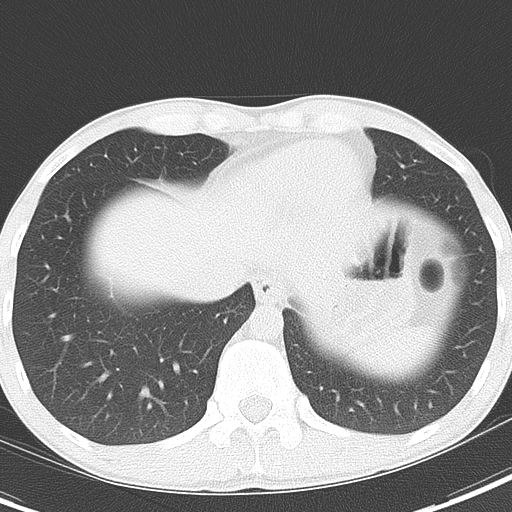

[Series 5: coronal · coronal · 0.60mm/px · 3 of 111 slices shown, 4 images]
[im 37/111  soft-tissue]
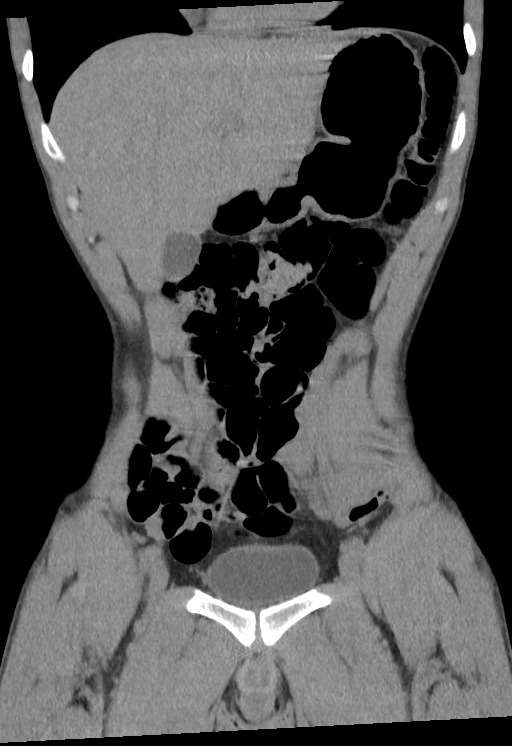
[im 49/111  soft-tissue]
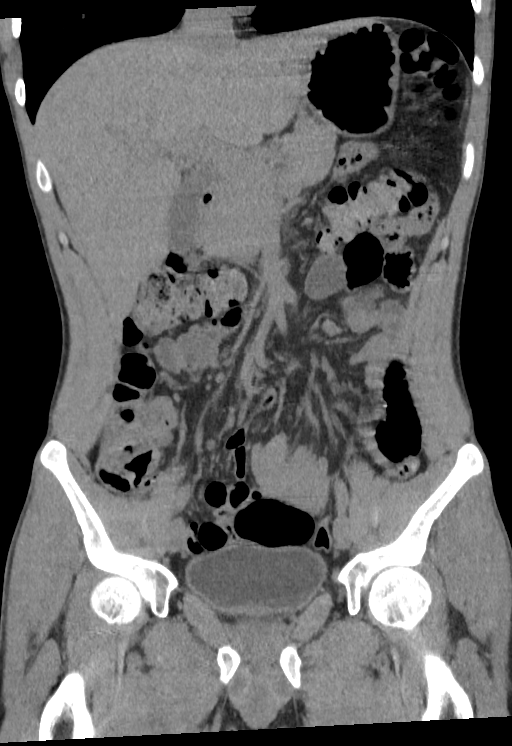
[im 49/111  bone]
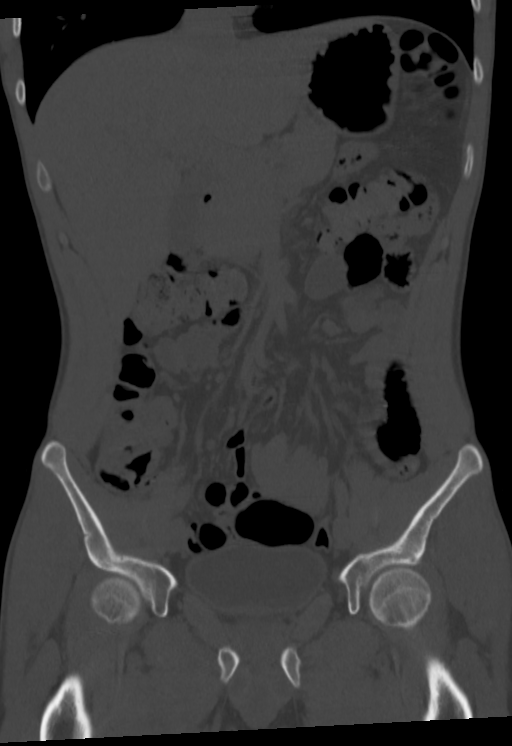
[im 62/111  soft-tissue]
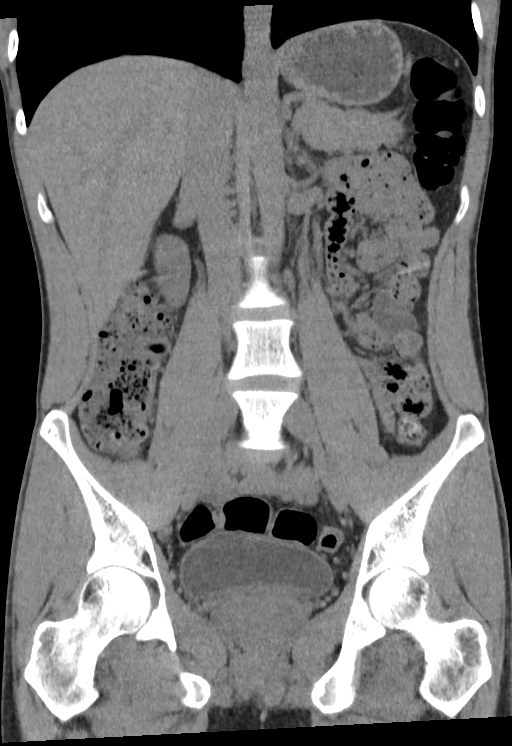

[Series 6: sagittal · sagittal · 0.45mm/px · 1 of 165 slices shown, 2 images]
[im 55/165  soft-tissue]
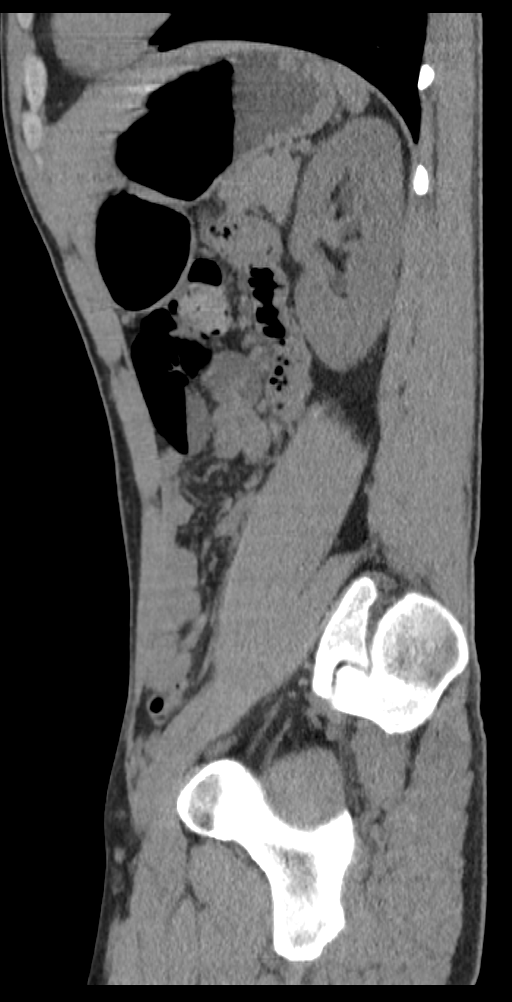
[im 55/165  bone]
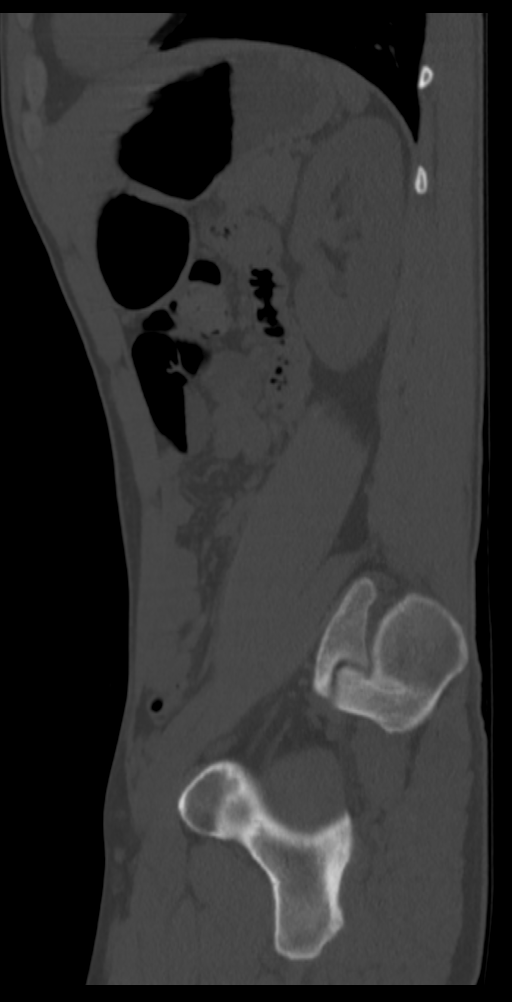

[8 of 46 positions shown; findings below may reference images not displayed]

FINDINGS: Lower chest: No acute abnormality.

Hepatobiliary: No focal liver abnormality is seen. No gallstones,
gallbladder wall thickening, or biliary dilatation.

Pancreas: Unremarkable. No pancreatic ductal dilatation or
surrounding inflammatory changes.

Spleen: Normal in size without focal abnormality.

Adrenals/Urinary Tract: Adrenal glands are unremarkable. Kidneys are
normal, without renal calculi, focal lesion, or hydronephrosis.
Bladder is unremarkable.

Stomach/Bowel: Stomach is within normal limits. Appendix appears
normal. No evidence of bowel wall thickening, distention, or
inflammatory changes.

Vascular/Lymphatic: No significant vascular findings are present. No
enlarged abdominal or pelvic lymph nodes.

Reproductive: Prostate is unremarkable.

Other: There is trace fluid in the pelvis as seen on coronal image
85 and axial image 67 of uncertain etiology. No other free fluid or
fluid collections. No free air.

Musculoskeletal: No acute or significant osseous findings.
IMPRESSION: 1. No renal stones or obstruction.
2. There is a tiny amount of fluid in the pelvis of uncertain
etiology.
3. No other abnormalities identified.

## 2022-12-31 ENCOUNTER — Ambulatory Visit: Payer: BC Managed Care – PPO

## 2022-12-31 DIAGNOSIS — Z31440 Encounter of male for testing for genetic disease carrier status for procreative management: Secondary | ICD-10-CM

## 2022-12-31 NOTE — Progress Notes (Signed)
Patient consented that we call his reproductive partner [place] (DOB 04/04/1993) with carrier screening results.

## 2023-01-18 LAB — HORIZON CUSTOM: REPORT SUMMARY: NEGATIVE

## 2023-01-19 ENCOUNTER — Telehealth: Payer: Self-pay

## 2023-01-19 NOTE — Telephone Encounter (Signed)
I attempted to call the patient with his carrier screening results. I was not able to leave a voicemail.
